# Patient Record
Sex: Female | Born: 1980 | Race: Asian | Hispanic: No | Marital: Married | Smoking: Never smoker
Health system: Southern US, Community
[De-identification: ages and names within clinical notes are randomized; demographics above are authoritative.]

## PROBLEM LIST (undated history)

## (undated) DIAGNOSIS — J45909 Unspecified asthma, uncomplicated: Secondary | ICD-10-CM

## (undated) DIAGNOSIS — N390 Urinary tract infection, site not specified: Secondary | ICD-10-CM

## (undated) HISTORY — DX: Urinary tract infection, site not specified: N39.0

## (undated) HISTORY — DX: Unspecified asthma, uncomplicated: J45.909

---

## 2012-09-20 DIAGNOSIS — E785 Hyperlipidemia, unspecified: Secondary | ICD-10-CM | POA: Insufficient documentation

## 2014-08-12 DIAGNOSIS — F32A Depression, unspecified: Secondary | ICD-10-CM | POA: Insufficient documentation

## 2014-08-12 DIAGNOSIS — F329 Major depressive disorder, single episode, unspecified: Secondary | ICD-10-CM | POA: Insufficient documentation

## 2018-03-08 ENCOUNTER — Ambulatory Visit: Payer: Self-pay | Admitting: Family Medicine

## 2018-03-08 DIAGNOSIS — Z0289 Encounter for other administrative examinations: Secondary | ICD-10-CM

## 2018-04-19 ENCOUNTER — Encounter: Payer: Self-pay | Admitting: Family Medicine

## 2018-04-19 ENCOUNTER — Ambulatory Visit (INDEPENDENT_AMBULATORY_CARE_PROVIDER_SITE_OTHER): Payer: No Typology Code available for payment source | Admitting: Family Medicine

## 2018-04-19 VITALS — BP 98/68 | HR 76 | Temp 98.4°F | Ht 62.0 in | Wt 137.0 lb

## 2018-04-19 DIAGNOSIS — Z0000 Encounter for general adult medical examination without abnormal findings: Secondary | ICD-10-CM | POA: Diagnosis not present

## 2018-04-19 LAB — COMPREHENSIVE METABOLIC PANEL
ALT: 7 U/L (ref 0–35)
AST: 9 U/L (ref 0–37)
Albumin: 4.5 g/dL (ref 3.5–5.2)
Alkaline Phosphatase: 54 U/L (ref 39–117)
BUN: 14 mg/dL (ref 6–23)
CALCIUM: 9.7 mg/dL (ref 8.4–10.5)
CO2: 28 mEq/L (ref 19–32)
Chloride: 107 mEq/L (ref 96–112)
Creatinine, Ser: 0.74 mg/dL (ref 0.40–1.20)
GFR: 89.09 mL/min (ref >60.00)
Glucose, Bld: 86 mg/dL (ref 70–99)
Potassium: 5 mEq/L (ref 3.5–5.1)
Sodium: 140 mEq/L (ref 135–145)
Total Bilirubin: 0.5 mg/dL (ref 0.2–1.2)
Total Protein: 7.4 g/dL (ref 6.0–8.3)

## 2018-04-19 LAB — CBC
HEMATOCRIT: 40.1 % (ref 36.0–46.0)
Hemoglobin: 13.3 g/dL (ref 12.0–15.0)
MCHC: 33.2 g/dL (ref 30.0–36.0)
MCV: 87 fl (ref 78.0–100.0)
Platelets: 236 10*3/uL (ref 150.0–400.0)
RBC: 4.62 Mil/uL (ref 3.87–5.11)
RDW: 13 % (ref 11.5–15.5)
WBC: 7.9 10*3/uL (ref 4.0–10.5)

## 2018-04-19 LAB — LIPID PANEL
Cholesterol: 205 mg/dL — ABNORMAL HIGH (ref 0–200)
HDL: 59.5 mg/dL (ref >39.00)
LDL Cholesterol: 117 mg/dL — ABNORMAL HIGH (ref 0–99)
NonHDL: 145.97
Total CHOL/HDL Ratio: 3
Triglycerides: 145 mg/dL (ref 0.0–149.0)
VLDL: 29 mg/dL (ref 0.0–40.0)

## 2018-04-19 NOTE — Patient Instructions (Addendum)
Give [place] 2-3 business days to get the results of your labs back.   Keep the diet clean and stay active.  Stretch your pectorals.  Let us know if you need anything.

## 2018-04-19 NOTE — Progress Notes (Signed)
Chief Complaint  Patient presents with  . New Patient (Initial Visit)     Well Woman Jody Douglas is here for a complete physical.   Her last physical was >1 year ago.  Current diet: in general, a "healthy" diet.   Current exercise: none; active at home, sometimes will walk Weight trend: has gained a little Daytime fatigue? Yes, works nights Seat belt? Yes.    Health Maintenance Pap/HPV- Yes Tetanus- Yes HIV screening- Yes  Past Medical History:  Diagnosis Date  . Asthma   . UTI (urinary tract infection)      Past Surgical History:  Procedure Laterality Date  . VAGINAL DELIVERY     2008, 2013 and 2018    Medications  No current outpatient medications on file prior to visit.   No current facility-administered medications on file prior to visit.      Allergies No Known Allergies  Review of Systems: Constitutional:  no unexpected weight changes Eye:  no recent significant change in vision Ear/Nose/Mouth/Throat:  Ears:  no tinnitus or vertigo and no recent change in hearing Nose/Mouth/Throat:  no complaints of nasal congestion, no sore throat Cardiovascular: no chest pain Respiratory:  no cough and no shortness of breath Gastrointestinal:  no abdominal pain, no change in bowel habits GU:  Female: negative for dysuria or pelvic pain Musculoskeletal/Extremities:  no pain of the joints Integumentary (Skin/Breast):  no abnormal skin lesions reported Neurologic:  no headaches Endocrine:  denies fatigue Hematologic/Lymphatic:  No areas of easy bleeding  Exam BP 98/68 (BP Location: Left Arm, Patient Position: Sitting, Cuff Size: Normal)   Pulse 76   Temp 98.4 F (36.9 C) (Oral)   Ht 5\' 2"  (1.575 m)   Wt 137 lb (62.1 kg)   SpO2 98%   BMI 25.06 kg/m  General:  well developed, well nourished, in no apparent distress Skin:  no significant moles, warts, or growths Head:  no masses, lesions, or tenderness Eyes:  pupils equal and round, sclera anicteric without  injection Ears:  canals without lesions, TMs shiny without retraction, no obvious effusion, no erythema Nose:  nares patent, septum midline, mucosa normal, and no drainage or sinus tenderness Throat/Pharynx:  lips and gingiva without lesion; tongue and uvula midline; non-inflamed pharynx; no exudates or postnasal drainage Neck: neck supple without adenopathy, thyromegaly, or masses Lungs:  clear to auscultation, breath sounds equal bilaterally, no respiratory distress Cardio:  regular rate and rhythm, no bruits, no LE edema Abdomen:  abdomen soft, nontender; bowel sounds normal; no masses or organomegaly Genital: Defer to GYN Musculoskeletal:  symmetrical muscle groups noted without atrophy or deformity Extremities:  no clubbing, cyanosis, or edema, no deformities, no skin discoloration Neuro:  gait normal; deep tendon reflexes normal and symmetric Psych: well oriented with normal range of affect and appropriate judgment/insight  Assessment and Plan  Well adult exam - Plan: CBC, Comprehensive metabolic panel, Lipid panel   Well 38 y.o. female. Counseled on diet and exercise. Other orders as above. Follow up in 1 yr or prn. The patient voiced understanding and agreement to the plan.  Jilda Roche [place], DO 04/19/18 8:49 AM

## 2018-08-27 ENCOUNTER — Encounter: Payer: Self-pay | Admitting: Family Medicine

## 2018-08-27 ENCOUNTER — Ambulatory Visit (INDEPENDENT_AMBULATORY_CARE_PROVIDER_SITE_OTHER): Payer: No Typology Code available for payment source | Admitting: Family Medicine

## 2018-08-27 DIAGNOSIS — S29012A Strain of muscle and tendon of back wall of thorax, initial encounter: Secondary | ICD-10-CM | POA: Diagnosis not present

## 2018-08-27 MED ORDER — MELOXICAM 15 MG PO TABS: 15.0000 mg | ORAL_TABLET | Freq: Every day | ORAL | 0 refills | Status: DC

## 2018-08-27 MED ORDER — CYCLOBENZAPRINE HCL 5 MG PO TABS: 2.5000 mg | ORAL_TABLET | Freq: Three times a day (TID) | ORAL | 0 refills | Status: DC | PRN

## 2018-08-27 MED FILL — MELOXICAM 15 MG TABLET: 15 | 30 days supply | Qty: 30 | Fill #0

## 2018-08-27 MED FILL — CYCLOBENZAPRINE HCL 5 MG TA: 5 | 6 days supply | Qty: 20 | Fill #0

## 2018-08-27 NOTE — Progress Notes (Signed)
Musculoskeletal Exam  Patient: Jody Douglas DOB: May 15, 1980  DOS: 08/27/2018  SUBJECTIVE:  Chief Complaint:   Chief Complaint  Patient presents with  . Back Pain    Jody Douglas is a 38 y.o.  female for evaluation and treatment of her back pain. Due to COVID-19 pandemic, we are interacting via web portal for an electronic face-to-face visit. I verified patient's ID using 2 identifiers. Patient agreed to proceed with visit via this method. Patient is at home, I am at office. Patient and I are present for visit.   Onset:  6 days ago.  No inj or change in activity.  Location: upper lateral Character:  aching  Progression of issue:  is unchanged Associated symptoms: none Denies bowel/bladder incontinence or weakness Treatment: to date has been acetaminophen.   Neurovascular symptoms: no  ROS: Musculoskeletal/Extremities: +back pain Neurologic: no numbness, tingling no weakness   Past Medical History:  Diagnosis Date  . Asthma     Objective: No conversational dyspnea Age appropriate judgment and insight Nml affect and mood  Assessment:  Strain of latissimus dorsi muscle, initial encounter - Plan: meloxicam (MOBIC) 15 MG tablet, cyclobenzaprine (FLEXERIL) 5 MG tablet, heat, stretch, don't get pregnant on these meds.   Plan: F/u prn. The patient voiced understanding and agreement to the plan.   [place], DO 08/27/18  2:56 PM

## 2018-08-28 ENCOUNTER — Telehealth: Payer: Self-pay

## 2018-08-28 NOTE — Telephone Encounter (Signed)
Copied from [place] [id number]. Topic: General - Other >> Aug 28, 2018 12:14 PM Sheran Luz wrote: Patient had appointment with Dr. Nani Ravens yesterday (6/29) and is requesting a letter for her employer saying that she may return back to work. Patient requests this be printed so she can pick up from office.

## 2018-08-28 NOTE — Telephone Encounter (Signed)
Printed

## 2018-08-29 NOTE — Telephone Encounter (Signed)
Left detailed message about paperwork left at the office    Kristie: Can you check to see if the paperwork is up front. Shirlean Mylar is not here today and neither am I

## 2018-08-29 NOTE — Telephone Encounter (Signed)
There is nothing in the file folders in the front.

## 2018-08-29 NOTE — Telephone Encounter (Signed)
The letter maybe on robins desk if not can you print off and leave in front file folders

## 2018-08-30 NOTE — Telephone Encounter (Signed)
Thanks

## 2018-08-30 NOTE — Telephone Encounter (Signed)
Late entry, this was printed and in front files

## 2018-10-15 ENCOUNTER — Ambulatory Visit (INDEPENDENT_AMBULATORY_CARE_PROVIDER_SITE_OTHER): Payer: No Typology Code available for payment source | Admitting: Family Medicine

## 2018-10-15 ENCOUNTER — Other Ambulatory Visit: Payer: Self-pay

## 2018-10-15 ENCOUNTER — Encounter: Payer: Self-pay | Admitting: Family Medicine

## 2018-10-15 VITALS — BP 110/60 | HR 79 | Temp 98.2°F | Wt 141.1 lb

## 2018-10-15 DIAGNOSIS — N3090 Cystitis, unspecified without hematuria: Secondary | ICD-10-CM | POA: Diagnosis not present

## 2018-10-15 LAB — POCT URINALYSIS DIPSTICK
Bilirubin, UA: NEGATIVE
Blood, UA: NEGATIVE
Glucose, UA: NEGATIVE
Ketones, UA: NEGATIVE
Leukocytes, UA: NEGATIVE
Nitrite, UA: NEGATIVE
Protein, UA: NEGATIVE
Spec Grav, UA: 1.025 (ref 1.010–1.025)
Urobilinogen, UA: 0.2 E.U./dL
pH, UA: 5.5 (ref 5.0–8.0)

## 2018-10-15 MED ORDER — SULFAMETHOXAZOLE-TRIMETHOPRIM 800-160 MG PO TABS: 1.0000 | ORAL_TABLET | Freq: Two times a day (BID) | ORAL | 0 refills | Status: AC

## 2018-10-15 MED ORDER — FLUCONAZOLE 150 MG PO TABS: 150.0000 mg | ORAL_TABLET | Freq: Once | ORAL | 0 refills | Status: AC

## 2018-10-15 MED ORDER — CEFTRIAXONE SODIUM 1 G IJ SOLR
1.0000 g | Freq: Once | INTRAMUSCULAR | Status: AC
  Administered 2018-10-15: 1 g via INTRAMUSCULAR

## 2018-10-15 MED FILL — SULFAMETHOXAZOLE-TMP DS TAB: 800-160 | 5 days supply | Qty: 10 | Fill #0

## 2018-10-15 MED FILL — FLUCONAZOLE 150 MG TABS: 150 | 1 days supply | Qty: 1 | Fill #0

## 2018-10-15 NOTE — Progress Notes (Signed)
Chief Complaint  Patient presents with  . Urinary Frequency  . Dysuria  . Back Pain    Jody Douglas is a 38 y.o. female here for possible UTI.  Duration: 1 week. Symptoms: urinary frequency, urinary hesitancy, urinary retention, flank pain on left and dysuria Denies: hematuria, fever, nausea and vomiting, vaginal discharge Hx of recurrent UTI? Yes Denies new sexual partners.  ROS:  Constitutional: denies fever GU: As noted in HPI  Past Medical History:  Diagnosis Date  . Asthma     BP 110/60 (BP Location: Left Arm, Patient Position: Sitting, Cuff Size: Normal)   Pulse 79   Temp 98.2 F (36.8 C) (Oral)   Wt 141 lb 2 oz (64 kg)   SpO2 97%   BMI 25.81 kg/m  General: Awake, alert, appears stated age Heart: RRR Lungs: CTAB, normal respiratory effort, no accessory muscle usage Abd: BS+, soft, TTP over suprapubic region, ND, no masses or organomegaly MSK: +L CVA tenderness Psych: Age appropriate judgment and insight  Cystitis - Plan: POCT Urinalysis Dipstick, Urine Culture, Bactrim, 1g Rocephin to cover perhaps early onset pyelo  Orders as above. Stay hydrated. Seek immediate care if pt starts to develop fevers, new/worsening symptoms, uncontrollable N/V. F/u prn. The patient voiced understanding and agreement to the plan.  [place], DO 10/15/18 3:10 PM

## 2018-10-15 NOTE — Addendum Note (Signed)
Addended by: Sharon Seller B on: 10/15/2018 03:27 PM   Modules accepted: Orders

## 2018-10-15 NOTE — Patient Instructions (Signed)
Stay hydrated.   Warning signs/symptoms: Uncontrollable nausea/vomiting, fevers, worsening symptoms despite treatment, confusion.  Give us around 2 business days to get culture back to you.  Let us know if you need anything. 

## 2018-10-16 LAB — URINE CULTURE
MICRO NUMBER:: 778755
SPECIMEN QUALITY:: ADEQUATE

## 2018-11-30 ENCOUNTER — Encounter: Payer: Self-pay | Admitting: Family Medicine

## 2018-12-03 ENCOUNTER — Encounter: Payer: Self-pay | Admitting: Family Medicine

## 2018-12-03 ENCOUNTER — Ambulatory Visit (HOSPITAL_BASED_OUTPATIENT_CLINIC_OR_DEPARTMENT_OTHER)
Admission: RE | Admit: 2018-12-03 | Discharge: 2018-12-03 | Disposition: A | Discharge status: Home / Self Care | Payer: No Typology Code available for payment source | Source: Ambulatory Visit | Attending: Family Medicine | Admitting: Family Medicine

## 2018-12-03 ENCOUNTER — Other Ambulatory Visit (HOSPITAL_COMMUNITY)
Admission: RE | Admit: 2018-12-03 | Discharge: 2018-12-03 | Disposition: A | Discharge status: Home / Self Care | Payer: No Typology Code available for payment source | Source: Ambulatory Visit | Attending: Family Medicine | Admitting: Family Medicine

## 2018-12-03 ENCOUNTER — Other Ambulatory Visit: Payer: Self-pay | Admitting: Family Medicine

## 2018-12-03 ENCOUNTER — Other Ambulatory Visit: Payer: Self-pay

## 2018-12-03 ENCOUNTER — Ambulatory Visit (INDEPENDENT_AMBULATORY_CARE_PROVIDER_SITE_OTHER): Payer: No Typology Code available for payment source | Admitting: Family Medicine

## 2018-12-03 VITALS — BP 110/78 | HR 76 | Temp 97.9°F | Ht 62.0 in | Wt 141.4 lb

## 2018-12-03 DIAGNOSIS — R109 Unspecified abdominal pain: Secondary | ICD-10-CM | POA: Diagnosis not present

## 2018-12-03 DIAGNOSIS — R300 Dysuria: Secondary | ICD-10-CM | POA: Diagnosis present

## 2018-12-03 LAB — POCT URINALYSIS DIPSTICK
Bilirubin, UA: NEGATIVE
Glucose, UA: NEGATIVE
Ketones, UA: NEGATIVE
Leukocytes, UA: NEGATIVE
Nitrite, UA: NEGATIVE
Protein, UA: NEGATIVE
Spec Grav, UA: 1.025 (ref 1.010–1.025)
Urobilinogen, UA: 0.2 E.U./dL
pH, UA: 5.5 (ref 5.0–8.0)

## 2018-12-03 MED ORDER — CEFDINIR 300 MG PO CAPS: 300.0000 mg | ORAL_CAPSULE | Freq: Two times a day (BID) | ORAL | 0 refills | Status: AC

## 2018-12-03 MED FILL — CEFDINIR 300 MG CAPSULE: 300 | 10 days supply | Qty: 20 | Fill #0

## 2018-12-03 NOTE — Patient Instructions (Addendum)
Stay hydrated.   Warning signs/symptoms: Uncontrollable nausea/vomiting, fevers, worsening symptoms despite treatment, confusion.  Give [place] around 2 business days to get culture back to you. We are checking a few other infective processes.   Make sure your bowel movements are normal!  We will be in touch regarding your X-ray.   Let us know if you need anything.

## 2018-12-03 NOTE — Progress Notes (Signed)
Chief Complaint  Patient presents with  . Back Pain  . Shoulder Pain  . Dysuria    Jody Douglas is a 38 y.o. female here for possible UTI.  Duration: 2 weeks. Symptoms: urinary hesitancy, flank pain on left and dysuria Denies: hematuria, fever, nausea, vomiting and urinary incontinence, vaginal discharge Hx of recurrent UTI? No Denies new sexual partners. She is on her cycle right now. She does have a history of kidney stones around 15 years ago.  Also of note, she has been having left-sided abdominal pain.  2 weeks ago, she went to pick up her child and felt a strain in her lower back.  The right side is since improved but her left side remains painful with the addition of other symptoms.  ROS:  Constitutional: denies fever GU: As noted in HPI  Past Medical History:  Diagnosis Date  . Asthma     BP 110/78 (BP Location: Left Arm, Patient Position: Sitting, Cuff Size: Normal)   Pulse 76   Temp 97.9 F (36.6 C) (Temporal)   Ht 5\' 2"  (1.575 m)   Wt 141 lb 6 oz (64.1 kg)   LMP 11/29/2018 (Exact Date)   SpO2 98%   BMI 25.86 kg/m  General: Awake, alert, appears stated age Heart: RRR Lungs: CTAB, normal respiratory effort, no accessory muscle usage Abd: BS+, soft, TTP over the left abdominal area, ND, no masses or organomegaly MSK: + Left-sided CVA tenderness; hamstrings are tight bilaterally, negative straight leg Psych: Age appropriate judgment and insight  Dysuria - Plan: DG Abd 1 View, POCT Urinalysis Dipstick, Urine Culture  Flank pain  X-ray to rule out kidney stone, she has no history of gout.  If this is positive, will treat for that, if it is negative, will treat for pyelonephritis.  I would like her to make sure her bowels are moving normally and she rehabs her back.  Offered pain medication but she declined.  Tylenol and heat/ice. Stay hydrated. Seek immediate care if pt starts to develop fevers, new/worsening symptoms, uncontrollable N/V. F/u prn. The  patient voiced understanding and agreement to the plan.  [place], DO 12/03/18 11:00 AM

## 2018-12-03 NOTE — Addendum Note (Signed)
Addended by: Sharon Seller B on: 12/03/2018 11:03 AM   Modules accepted: Orders

## 2018-12-05 LAB — URINE CULTURE
MICRO NUMBER:: 954111
Result:: NO GROWTH
SPECIMEN QUALITY:: ADEQUATE

## 2018-12-06 ENCOUNTER — Other Ambulatory Visit: Payer: Self-pay | Admitting: Family Medicine

## 2018-12-06 MED ORDER — METRONIDAZOLE 500 MG PO TABS: 500.0000 mg | ORAL_TABLET | Freq: Two times a day (BID) | ORAL | 0 refills | Status: AC

## 2018-12-06 MED FILL — METRONIDAZOLE 500 MG TABS: 500 | 7 days supply | Qty: 14 | Fill #0

## 2018-12-10 ENCOUNTER — Encounter: Payer: Self-pay | Admitting: Family Medicine

## 2018-12-14 ENCOUNTER — Other Ambulatory Visit: Payer: Self-pay

## 2018-12-14 ENCOUNTER — Ambulatory Visit (INDEPENDENT_AMBULATORY_CARE_PROVIDER_SITE_OTHER): Payer: No Typology Code available for payment source

## 2018-12-14 DIAGNOSIS — Z23 Encounter for immunization: Secondary | ICD-10-CM

## 2019-01-01 ENCOUNTER — Encounter: Payer: Self-pay | Admitting: Advanced Practice Midwife

## 2019-01-01 ENCOUNTER — Ambulatory Visit (INDEPENDENT_AMBULATORY_CARE_PROVIDER_SITE_OTHER): Payer: No Typology Code available for payment source | Admitting: Advanced Practice Midwife

## 2019-01-01 ENCOUNTER — Other Ambulatory Visit: Payer: Self-pay

## 2019-01-01 ENCOUNTER — Other Ambulatory Visit (HOSPITAL_BASED_OUTPATIENT_CLINIC_OR_DEPARTMENT_OTHER): Payer: No Typology Code available for payment source

## 2019-01-01 VITALS — BP 121/80 | HR 77 | Ht 61.0 in | Wt 137.0 lb

## 2019-01-01 DIAGNOSIS — R319 Hematuria, unspecified: Secondary | ICD-10-CM

## 2019-01-01 DIAGNOSIS — N898 Other specified noninflammatory disorders of vagina: Secondary | ICD-10-CM

## 2019-01-01 DIAGNOSIS — Z113 Encounter for screening for infections with a predominantly sexual mode of transmission: Secondary | ICD-10-CM | POA: Diagnosis not present

## 2019-01-01 DIAGNOSIS — R399 Unspecified symptoms and signs involving the genitourinary system: Secondary | ICD-10-CM | POA: Diagnosis not present

## 2019-01-01 DIAGNOSIS — R109 Unspecified abdominal pain: Secondary | ICD-10-CM

## 2019-01-01 DIAGNOSIS — B9689 Other specified bacterial agents as the cause of diseases classified elsewhere: Secondary | ICD-10-CM

## 2019-01-01 DIAGNOSIS — N760 Acute vaginitis: Secondary | ICD-10-CM

## 2019-01-01 LAB — POCT URINALYSIS DIPSTICK
Glucose, UA: NEGATIVE
Ketones, UA: NEGATIVE
Nitrite, UA: NEGATIVE
Protein, UA: POSITIVE — AB
Spec Grav, UA: 1.02 (ref 1.010–1.025)
pH, UA: 6 (ref 5.0–8.0)

## 2019-01-01 MED ORDER — TERCONAZOLE 0.4 % VA CREA: 1.0000 | TOPICAL_CREAM | Freq: Every day | VAGINAL | 2 refills | Status: DC

## 2019-01-01 MED FILL — TERCONAZOLE 0.4% CREAM: 0.4 | 7 days supply | Qty: 45 | Fill #0

## 2019-01-01 NOTE — Progress Notes (Signed)
Pt states she has been having vaginal itching with discharge x 3 months.

## 2019-01-01 NOTE — Patient Instructions (Signed)
Vaginitis Vaginitis is a condition in which the vaginal tissue swells and becomes red (inflamed). This condition is most often caused by a change in the normal balance of bacteria and yeast that live in the vagina. This change causes an overgrowth of certain bacteria or yeast, which causes the inflammation. There are different types of vaginitis, but the most common types are:  Bacterial vaginosis.  Yeast infection (candidiasis).  Trichomoniasis vaginitis. This is a sexually transmitted disease (STD).  Viral vaginitis.  Atrophic vaginitis.  Allergic vaginitis. What are the causes? The cause of this condition depends on the type of vaginitis. It can be caused by:  Bacteria (bacterial vaginosis).  Yeast, which is a fungus (yeast infection).  A parasite (trichomoniasis vaginitis).  A virus (viral vaginitis).  Low hormone levels (atrophic vaginitis). Low hormone levels can occur during pregnancy, breastfeeding, or after menopause.  Irritants, such as bubble baths, scented tampons, and feminine sprays (allergic vaginitis). Other factors can change the normal balance of the yeast and bacteria that live in the vagina. These include:  Antibiotic medicines.  Poor hygiene.  Diaphragms, vaginal sponges, spermicides, birth control pills, and intrauterine devices (IUD).  Sex.  Infection.  Uncontrolled diabetes.  A weakened defense (immune) system. What increases the risk? This condition is more likely to develop in women who:  Smoke.  Use vaginal douches, scented tampons, or scented sanitary pads.  Wear tight-fitting pants.  Wear thong underwear.  Use oral birth control pills or an IUD.  Have sex without a condom.  Have multiple sex partners.  Have an STD.  Frequently use the spermicide nonoxynol-9.  Eat lots of foods high in sugar.  Have uncontrolled diabetes.  Have low estrogen levels.  Have a weakened immune system from an immune disorder or medical  treatment.  Are pregnant or breastfeeding. What are the signs or symptoms? Symptoms vary depending on the cause of the vaginitis. Common symptoms include:  Abnormal vaginal discharge. ? The discharge is white, gray, or yellow with bacterial vaginosis. ? The discharge is thick, white, and cheesy with a yeast infection. ? The discharge is frothy and yellow or greenish with trichomoniasis.  A bad vaginal smell. The smell is fishy with bacterial vaginosis.  Vaginal itching, pain, or swelling.  Sex that is painful.  Pain or burning when urinating. Sometimes there are no symptoms. How is this diagnosed? This condition is diagnosed based on your symptoms and medical history. A physical exam, including a pelvic exam, will also be done. You may also have other tests, including:  Tests to determine the pH level (acidity or alkalinity) of your vagina.  A whiff test, to assess the odor that results when a sample of your vaginal discharge is mixed with a potassium hydroxide solution.  Tests of vaginal fluid. A sample will be examined under a microscope. How is this treated? Treatment varies depending on the type of vaginitis you have. Your treatment may include:  Antibiotic creams or pills to treat bacterial vaginosis and trichomoniasis.  Antifungal medicines, such as vaginal creams or suppositories, to treat a yeast infection.  Medicine to ease discomfort if you have viral vaginitis. Your sexual partner should also be treated.  Estrogen delivered in a cream, pill, suppository, or vaginal ring to treat atrophic vaginitis. If vaginal dryness occurs, lubricants and moisturizing creams may help. You may need to avoid scented soaps, sprays, or douches.  Stopping use of a product that is causing allergic vaginitis. Then using a vaginal cream to treat the symptoms. Follow   these instructions at home: Lifestyle  Keep your genital area clean and dry. Avoid soap, and only rinse the area with  water.  Do not douche or use tampons until your health care provider says it is okay to do so. Use sanitary pads, if needed.  Do not have sex until your health care provider approves. When you can return to sex, practice safe sex and use condoms.  Wipe from front to back. This avoids the spread of bacteria from the rectum to the vagina. General instructions  Take over-the-counter and prescription medicines only as told by your health care provider.  If you were prescribed an antibiotic medicine, take or use it as told by your health care provider. Do not stop taking or using the antibiotic even if you start to feel better.  Keep all follow-up visits as told by your health care provider. This is important. How is this prevented?  Use mild, non-scented products. Do not use things that can irritate the vagina, such as fabric softeners. Avoid the following products if they are scented: ? Feminine sprays. ? Detergents. ? Tampons. ? Feminine hygiene products. ? Soaps or bubble baths.  Let air reach your genital area. ? Wear cotton underwear to reduce moisture buildup. ? Avoid wearing underwear while you sleep. ? Avoid wearing tight pants and underwear or nylons without a cotton panel. ? Avoid wearing thong underwear.  Take off any wet clothing, such as bathing suits, as soon as possible.  Practice safe sex and use condoms. Contact a health care provider if:  You have abdominal pain.  You have a fever.  You have symptoms that last for more than 2-3 days. Get help right away if:  You have a fever and your symptoms suddenly get worse. Summary  Vaginitis is a condition in which the vaginal tissue becomes inflamed.This condition is most often caused by a change in the normal balance of bacteria and yeast that live in the vagina.  Treatment varies depending on the type of vaginitis you have.  Do not douche, use tampons , or have sex until your health care provider approves. When  you can return to sex, practice safe sex and use condoms. This information is not intended to replace advice given to you by your health care provider. Make sure you discuss any questions you have with your health care provider. Document Released: 12/12/2006 Document Revised: 01/27/2017 Document Reviewed: 03/22/2016 Elsevier Patient Education  2020 [employer] Flank Pain, Adult Flank pain is pain in your side. The flank is the area of your side between your upper belly (abdomen) and your back. The pain may occur over a short time (acute), or it may be long-term or come back often (chronic). It may be mild or very bad. Pain in this area can be caused by many different things. Follow these instructions at home:   Drink enough fluid to keep your pee (urine) clear or pale yellow.  Rest as told by your doctor.  Take over-the-counter and prescription medicines only as told by your doctor.  Keep a journal to keep track of: ? What has caused your flank pain. ? What has made it feel better.  Keep all follow-up visits as told by your doctor. This is important. Contact a doctor if:  Medicine does not help your pain.  You have new symptoms.  Your pain gets worse.  You have a fever.  Your symptoms last longer than 2-3 days.  You have trouble peeing.  You are  peeing more often than normal. Get help right away if:  You have trouble breathing.  You are short of breath.  Your belly hurts, or it is swollen or red.  You feel sick to your stomach (nauseous).  You throw up (vomit).  You feel like you will pass out, or you do pass out (faint).  You have blood in your pee. Summary  Flank pain is pain in your side. The flank is the area of your side between your upper belly (abdomen) and your back.  Flank pain may occur over a short time (acute), or it may be long-term or come back often (chronic). It may be mild or very bad.  Pain in this area can be caused by many different  things.  Contact your doctor if your symptoms get worse or they last longer than 2-3 days. This information is not intended to replace advice given to you by your health care provider. Make sure you discuss any questions you have with your health care provider. Document Released: 11/24/2007 Document Revised: 01/27/2017 Document Reviewed: 06/06/2016 Elsevier Patient Education  2020 [employer].

## 2019-01-01 NOTE — Progress Notes (Addendum)
GYNECOLOGY ENCOUNTER NOTE  Subjective:   Jody Douglas is a 38 y.o. [phone] female here for gynecologic exam.  Current complaints: Vaginal discharge and itching.  Itching comes and goes.  Not much odor.  Also has persistent left flank pain.  Had an xray a month ago which was negative.  Worried she has a kidney stone.  Pain is left flank and lower back.   Denies abnormal vaginal bleeding, discharge, pelvic pain, or other gynecologic concerns.    Gynecologic History Patient's last menstrual period was 11/30/2018. Contraception: none  Obstetric History OB History  Gravida Para Term Preterm AB Living  4 3 3   1 3   SAB TAB Ectopic Multiple Live Births    1     3    # Outcome Date GA Lbr Len/2nd Weight Sex Delivery Anes PTL Lv  4 Term 2018 [redacted]w[redacted]d   M Vag-Spont None N LIV  3 TAB 2013          2 Term 2013 [redacted]w[redacted]d   M Vag-Spont None N LIV  1 Term 2008 [redacted]w[redacted]d   M Vag-Spont EPI N LIV    Past Medical History:  Diagnosis Date  . Asthma     Past Surgical History:  Procedure Laterality Date  . VAGINAL DELIVERY     2008, 2013 and 2018    Current Outpatient Medications on File Prior to Visit  Medication Sig Dispense Refill  . fluconazole (DIFLUCAN) 150 MG tablet      No current facility-administered medications on file prior to visit.     No Known Allergies  Social History   Socioeconomic History  . Marital status: Married    Spouse name: Not on file  . Number of children: Not on file  . Years of education: Not on file  . Highest education level: Not on file  Occupational History  . Not on file  Social Needs  . Financial resource strain: Not on file  . Food insecurity    Worry: Not on file    Inability: Not on file  . Transportation needs    Medical: Not on file    Non-medical: Not on file  Tobacco Use  . Smoking status: Never Smoker  . Smokeless tobacco: Never Used  Substance and Sexual Activity  . Alcohol use: Yes    Frequency: Never    Comment: a little wine  maybe  . Drug use: Never  . Sexual activity: Yes    Birth control/protection: None  Lifestyle  . Physical activity    Days per week: Not on file    Minutes per session: Not on file  . Stress: Not on file  Relationships  . Social Herbalist on phone: Not on file    Gets together: Not on file    Attends religious service: Not on file    Active member of club or organization: Not on file    Attends meetings of clubs or organizations: Not on file    Relationship status: Not on file  . Intimate partner violence    Fear of current or ex partner: Not on file    Emotionally abused: Not on file    Physically abused: Not on file    Forced sexual activity: Not on file  Other Topics Concern  . Not on file  Social History Narrative  . Not on file    Family History  Problem Relation Age of Onset  . Asthma Mother   . Hypertension Mother   .  Hyperlipidemia Father   . Asthma Sister   . Asthma Son     The following portions of the patient's history were reviewed and updated as appropriate: allergies, current medications, past family history, past medical history, past social history, past surgical history and problem list.  Review of Systems A comprehensive review of systems was negative except for: Gastrointestinal: positive for flank pain on left with low back pain Genitourinary: positive for vaginal discharge and vaginal itching off and on   Objective:  BP 121/80   Pulse 77   Ht 5\' 1"  (1.549 m)   Wt 62.1 kg   LMP 11/30/2018   BMI 25.89 kg/m  CONSTITUTIONAL: Well-developed, well-nourished female in no acute distress.  NEUROLGIC: Alert and oriented to person, place, and time.  PSYCHIATRIC: Normal mood and affect. Normal behavior. CARDIOVASCULAR: Normal heart rate noted RESPIRATORY: Effort normal, no problems with respiration noted. ABDOMEN: Soft, normal bowel sounds, no distention noted.  No tenderness, rebound or guarding.  PELVIC: Normal appearing external  genitalia; normal appearing vaginal mucosa and cervix.  No abnormal discharge noted.  Some mild erethema at introitus of vagina.   MUSCULOSKELETAL: Normal range of motion.   Assessment:  Vaginal discharge Vaginal itching Left flank pain with low back pain, persistent Microscopic hematuria   Plan:  Will follow up results of  Cervicovaginal testing Rx Terazol 7 for presumed yeast vaginitis Consulted Dr 01/30/2019 re: hematuria and flank pain.  Recommends CT stone study Will refer to Urology as needed. Please refer to After Visit Summary for other counseling recommendations.    Adrian Blackwater CNM, MSN [facility]  Post visit addendum: + BV Will send Rx for Flagyl [facility], CNM

## 2019-01-02 LAB — CERVICOVAGINAL ANCILLARY ONLY
Bacterial Vaginitis (gardnerella): POSITIVE — AB
Candida Glabrata: NEGATIVE
Candida Vaginitis: NEGATIVE
Chlamydia: NEGATIVE
Comment: NEGATIVE
Comment: NEGATIVE
Comment: NEGATIVE
Comment: NEGATIVE
Comment: NEGATIVE
Comment: NORMAL
Neisseria Gonorrhea: NEGATIVE
Trichomonas: NEGATIVE

## 2019-01-02 MED ORDER — METRONIDAZOLE 500 MG PO TABS: 500.0000 mg | ORAL_TABLET | Freq: Two times a day (BID) | ORAL | 0 refills | Status: AC

## 2019-01-02 NOTE — Addendum Note (Signed)
Addended by: Seabron Spates on: 01/02/2019 08:16 PM   Modules accepted: Orders

## 2019-01-03 ENCOUNTER — Other Ambulatory Visit: Payer: Self-pay | Admitting: Advanced Practice Midwife

## 2019-01-03 ENCOUNTER — Telehealth: Payer: Self-pay

## 2019-01-03 DIAGNOSIS — R109 Unspecified abdominal pain: Secondary | ICD-10-CM

## 2019-01-03 DIAGNOSIS — N3001 Acute cystitis with hematuria: Secondary | ICD-10-CM

## 2019-01-03 LAB — URINE CULTURE

## 2019-01-03 MED ORDER — NITROFURANTOIN MONOHYD MACRO 100 MG PO CAPS: 100.0000 mg | ORAL_CAPSULE | Freq: Two times a day (BID) | ORAL | 1 refills | Status: DC

## 2019-01-03 MED FILL — METRONIDAZOLE 500 MG TABS: 500 | 7 days supply | Qty: 14 | Fill #0

## 2019-01-03 NOTE — Telephone Encounter (Signed)
Called and left message for pt to call the office back. chiquita l wilson, CMA 

## 2019-01-03 NOTE — Telephone Encounter (Signed)
-----   Message from Seabron Spates, CNM sent at 01/02/2019  8:15 PM EST ----- Regarding: New Rx Got results + BV I sent Rx to [facility] for flagyl  Can you call?  Lelan Pons

## 2019-01-03 NOTE — Telephone Encounter (Signed)
Pt called the office requesting results. Pt made aware that she tested positive for BV.  Understanding was voiced.  Medication was sent to pharmacy.  chiquita l wilson, CMA

## 2019-01-03 NOTE — Progress Notes (Signed)
Urine grew E Coli Resistant to many meds Sensitive to [facility]  Rx sent to pharmacy

## 2019-01-04 ENCOUNTER — Telehealth: Payer: Self-pay

## 2019-01-04 MED FILL — NITROFURANTOIN MONO-MCR 100: 100 | 7 days supply | Qty: 14 | Fill #0

## 2019-01-04 NOTE — Telephone Encounter (Signed)
-----   Message from Seabron Spates, CNM sent at 01/03/2019  8:32 PM EST ----- Regarding: UTI med sent She grew E Coli in urine Probably why she had pain  I sent Macrobid to pharmacy It was resistant to other meds  Can yall call her?  Jody Douglas

## 2019-01-04 NOTE — Telephone Encounter (Signed)
-----   Message from Marie L Williams, CNM sent at 01/03/2019  8:32 PM EST ----- Regarding: UTI med sent She grew E Coli in urine Probably why she had pain  I sent Macrobid to pharmacy It was resistant to other meds  Can yall call her?  Marie  

## 2019-01-04 NOTE — Telephone Encounter (Signed)
Patient called and made aware of UTI and that it is suspectible to [place]. Patient made aware that Hansel Feinstein, CNM has sent in prescription to [facility] for patient to pick up and start taking. Patient states understanding. Kathrene Alu RN

## 2019-01-04 NOTE — Telephone Encounter (Signed)
Called pt regarding results. Pt made aware that she has a UTI and Macrobid was sent to her pharmacy. Understanding was voiced.  Jody Douglas l [name], CMA

## 2019-01-10 ENCOUNTER — Other Ambulatory Visit: Payer: Self-pay

## 2019-01-10 ENCOUNTER — Ambulatory Visit (HOSPITAL_BASED_OUTPATIENT_CLINIC_OR_DEPARTMENT_OTHER)
Admission: RE | Admit: 2019-01-10 | Discharge: 2019-01-10 | Disposition: A | Discharge status: Home / Self Care | Payer: No Typology Code available for payment source | Source: Ambulatory Visit | Attending: Advanced Practice Midwife | Admitting: Advanced Practice Midwife

## 2019-01-10 DIAGNOSIS — R109 Unspecified abdominal pain: Secondary | ICD-10-CM | POA: Insufficient documentation

## 2019-01-15 ENCOUNTER — Encounter: Payer: Self-pay | Admitting: Advanced Practice Midwife

## 2019-01-18 ENCOUNTER — Encounter: Payer: Self-pay | Admitting: Family Medicine

## 2019-02-15 ENCOUNTER — Ambulatory Visit (INDEPENDENT_AMBULATORY_CARE_PROVIDER_SITE_OTHER): Payer: No Typology Code available for payment source | Admitting: Family Medicine

## 2019-02-15 ENCOUNTER — Other Ambulatory Visit: Payer: Self-pay

## 2019-02-15 ENCOUNTER — Other Ambulatory Visit (HOSPITAL_COMMUNITY)
Admission: RE | Admit: 2019-02-15 | Discharge: 2019-02-15 | Disposition: A | Discharge status: Home / Self Care | Payer: No Typology Code available for payment source | Source: Ambulatory Visit | Attending: Family Medicine | Admitting: Family Medicine

## 2019-02-15 ENCOUNTER — Encounter: Payer: Self-pay | Admitting: Family Medicine

## 2019-02-15 VITALS — BP 102/68 | HR 86 | Temp 97.3°F | Ht 61.0 in | Wt 141.1 lb

## 2019-02-15 DIAGNOSIS — R300 Dysuria: Secondary | ICD-10-CM

## 2019-02-15 DIAGNOSIS — N898 Other specified noninflammatory disorders of vagina: Secondary | ICD-10-CM | POA: Diagnosis present

## 2019-02-15 DIAGNOSIS — S46811A Strain of other muscles, fascia and tendons at shoulder and upper arm level, right arm, initial encounter: Secondary | ICD-10-CM

## 2019-02-15 LAB — POC URINALSYSI DIPSTICK (AUTOMATED)
Bilirubin, UA: NEGATIVE
Blood, UA: NEGATIVE
Glucose, UA: NEGATIVE
Ketones, UA: NEGATIVE
Leukocytes, UA: NEGATIVE
Nitrite, UA: NEGATIVE
Protein, UA: NEGATIVE
Spec Grav, UA: 1.02 (ref 1.010–1.025)
Urobilinogen, UA: 0.2 E.U./dL
pH, UA: 6 (ref 5.0–8.0)

## 2019-02-15 NOTE — Progress Notes (Signed)
Chief Complaint  Patient presents with  . Back Pain  . Urinary Tract Infection    Jody Douglas is a 38 y.o. female here for possible UTI.  Duration: 3 months. Symptoms: Dysuria, urinary frequency, urinary hesitancy and chills Denies: hematuria, fever, nausea and vomiting, vaginal discharge Hx of recurrent UTI? No Denies new sexual partners. Tx'd for BV by me, completed course, did not fully work. Tx'd for BV by GYN and for UTI proven on culture, did not help. Compliant w medication.  R shoulder pain for past 1 week. Worse after she works. No inj or change in activity otherwise. Denies bruising, redness, swelling, weakness.   ROS:  Constitutional: denies fever GU: As noted in HPI  Past Medical History:  Diagnosis Date  . Asthma      BP 102/68 (BP Location: Left Arm, Patient Position: Sitting, Cuff Size: Normal)   Pulse 86   Temp (!) 97.3 F (36.3 C) (Temporal)   Ht 5\' 1"  (1.549 m)   Wt 141 lb 2 oz (64 kg)   SpO2 99%   BMI 26.67 kg/m  General: Awake, alert, appears stated age Heart: RRR Lungs: CTAB, normal respiratory effort, no accessory muscle usage Abd: BS+, soft, NT, ND, no masses or organomegaly MSK: No CVA tenderness, neg Lloyd's sign; +TTP over R trap, nml ROM, no ttp over cerv spine msc Neuro: Neg Spurling's Psych: Age appropriate judgment and insight  Dysuria - Plan: Urine Culture, Urine cytology ancillary only([place]), POCT Urinalysis Dipstick (Automated), CANCELED: POCT Urinalysis Dipstick (Automated)  Vaginal discharge - Plan: Urine Culture, Urine cytology ancillary only([place]), CANCELED: POCT Urinalysis Dipstick (Automated)  Strain of right trapezius muscle, initial encounter  1- States her BM's are neg, OB did exam and no pelvic floor ttp. Question BV? Could do clinda if 2 rounds of Flagyl was not helpful. Could add vag probiotic. Await urine culture. IC now a consideration. If all of her workup is normal, I will rec Uribel or pyridium and  refer to urology. 2- She has stretches/exercises for this, has not been compliant. NSAIDs, heat, TYlenol, needs to do them. F/u prn. The patient voiced understanding and agreement to the plan.  [place], DO 02/15/19 2:43 PM

## 2019-02-15 NOTE — Patient Instructions (Signed)
Stay hydrated.   Warning signs/symptoms: Uncontrollable nausea/vomiting, fevers, worsening symptoms despite treatment, confusion.  Give [place] around 2 business days to get culture back to you. Some of the other results can take up to a week. No news is good news in general.   Let us know if you need anything.

## 2019-02-16 ENCOUNTER — Encounter: Payer: Self-pay | Admitting: Family Medicine

## 2019-02-16 LAB — URINE CULTURE
MICRO NUMBER:: 1212804
Result:: NO GROWTH
SPECIMEN QUALITY:: ADEQUATE

## 2019-02-21 LAB — URINE CYTOLOGY ANCILLARY ONLY
Bacterial Vaginitis-Urine: POSITIVE — AB
Candida Glabrata: NEGATIVE
Candida Vaginitis: NEGATIVE
Chlamydia: NEGATIVE
Comment: NEGATIVE
Comment: NEGATIVE
Comment: NEGATIVE
Comment: NEGATIVE
Comment: NORMAL
Neisseria Gonorrhea: NEGATIVE
Trichomonas: NEGATIVE

## 2019-02-22 ENCOUNTER — Other Ambulatory Visit: Payer: Self-pay | Admitting: Family Medicine

## 2019-02-22 MED ORDER — CLINDAMYCIN PHOSPHATE 2 % VA CREA: 1.0000 | TOPICAL_CREAM | Freq: Every day | VAGINAL | 0 refills | Status: AC

## 2019-02-25 MED FILL — CLINDAMYCIN 2% VAGINAL CRM: 2 | 5 days supply | Qty: 40 | Fill #0

## 2019-04-16 ENCOUNTER — Ambulatory Visit (INDEPENDENT_AMBULATORY_CARE_PROVIDER_SITE_OTHER): Payer: No Typology Code available for payment source | Admitting: Family Medicine

## 2019-04-16 ENCOUNTER — Other Ambulatory Visit: Payer: Self-pay

## 2019-04-16 ENCOUNTER — Encounter: Payer: Self-pay | Admitting: Family Medicine

## 2019-04-16 VITALS — BP 98/62 | HR 79 | Temp 97.7°F | Ht 61.0 in | Wt 140.0 lb

## 2019-04-16 DIAGNOSIS — Z0000 Encounter for general adult medical examination without abnormal findings: Secondary | ICD-10-CM

## 2019-04-16 LAB — CBC
HCT: 38.7 % (ref 36.0–46.0)
Hemoglobin: 12.9 g/dL (ref 12.0–15.0)
MCHC: 33.3 g/dL (ref 30.0–36.0)
MCV: 86.4 fl (ref 78.0–100.0)
Platelets: 209 10*3/uL (ref 150.0–400.0)
RBC: 4.48 Mil/uL (ref 3.87–5.11)
RDW: 12.8 % (ref 11.5–15.5)
WBC: 6.8 10*3/uL (ref 4.0–10.5)

## 2019-04-16 LAB — COMPREHENSIVE METABOLIC PANEL
ALT: 9 U/L (ref 0–35)
AST: 11 U/L (ref 0–37)
Albumin: 4.4 g/dL (ref 3.5–5.2)
Alkaline Phosphatase: 49 U/L (ref 39–117)
BUN: 10 mg/dL (ref 6–23)
CO2: 27 mEq/L (ref 19–32)
Calcium: 9.2 mg/dL (ref 8.4–10.5)
Chloride: 104 mEq/L (ref 96–112)
Creatinine, Ser: 0.64 mg/dL (ref 0.40–1.20)
GFR: 104.75 mL/min (ref >60.00)
Glucose, Bld: 86 mg/dL (ref 70–99)
Potassium: 3.6 mEq/L (ref 3.5–5.1)
Sodium: 137 mEq/L (ref 135–145)
Total Bilirubin: 0.6 mg/dL (ref 0.2–1.2)
Total Protein: 7.2 g/dL (ref 6.0–8.3)

## 2019-04-16 LAB — LIPID PANEL
Cholesterol: 205 mg/dL — ABNORMAL HIGH (ref 0–200)
HDL: 66.8 mg/dL (ref >39.00)
LDL Cholesterol: 121 mg/dL — ABNORMAL HIGH (ref 0–99)
NonHDL: 138.06
Total CHOL/HDL Ratio: 3
Triglycerides: 87 mg/dL (ref 0.0–149.0)
VLDL: 17.4 mg/dL (ref 0.0–40.0)

## 2019-04-16 NOTE — Progress Notes (Signed)
Chief Complaint  Patient presents with  . Annual Exam     Well Woman Analys Jody Douglas is here for a complete physical.   Her last physical was >1 year ago.  Current diet: in general, an "OK" diet. Current exercise: walking, stairs. Seatbelt? Yes  Health Maintenance Pap/HPV- Yes Tetanus- Yes HIV screening- Yes  Past Medical History:  Diagnosis Date  . Asthma      Past Surgical History:  Procedure Laterality Date  . VAGINAL DELIVERY     2008, 2013 and 2018    Medications  No current outpatient medications on file prior to visit.   No current facility-administered medications on file prior to visit.     Allergies No Known Allergies  Review of Systems: Constitutional:  no unexpected weight changes Eye:  no recent significant change in vision Ear/Nose/Mouth/Throat:  Ears:  no tinnitus or vertigo and no recent change in hearing Nose/Mouth/Throat:  no complaints of nasal congestion, no sore throat Cardiovascular: no chest pain Respiratory:  no cough and no shortness of breath Gastrointestinal:  no abdominal pain, no change in bowel habits GU:  Female: negative for dysuria or pelvic pain Musculoskeletal/Extremities:  no pain of the joints Integumentary (Skin/Breast):  no abnormal skin lesions reported Neurologic:  no headaches Endocrine:  denies fatigue Hematologic/Lymphatic:  No areas of easy bleeding  Exam BP 98/62 (BP Location: Left Arm, Patient Position: Sitting, Cuff Size: Normal)   Pulse 79   Temp 97.7 F (36.5 C) (Temporal)   Ht 5\' 1"  (1.549 m)   Wt 63.5 kg   SpO2 98%   BMI 26.45 kg/m  General:  well developed, well nourished, in no apparent distress Skin:  no significant moles, warts, or growths Head:  no masses, lesions, or tenderness Eyes:  pupils equal and round, sclera anicteric without injection Ears:  canals without lesions, TMs shiny without retraction, no obvious effusion, no erythema Nose:  nares patent, septum midline, mucosa normal, and  no drainage or sinus tenderness Throat/Pharynx:  lips and gingiva without lesion; tongue and uvula midline; non-inflamed pharynx; no exudates or postnasal drainage Neck: neck supple without adenopathy, thyromegaly, or masses Lungs:  clear to auscultation, breath sounds equal bilaterally, no respiratory distress Cardio:  regular rate and rhythm, no bruits, no LE edema Abdomen:  abdomen soft, nontender; bowel sounds normal; no masses or organomegaly Genital: Defer to GYN Musculoskeletal:  symmetrical muscle groups noted without atrophy or deformity Extremities:  no clubbing, cyanosis, or edema, no deformities, no skin discoloration Neuro:  gait normal; deep tendon reflexes normal and symmetric Psych: well oriented with normal range of affect and appropriate judgment/insight  Assessment and Plan  Well adult exam   Well 39 y.o. female. Counseled on diet and exercise. Other orders as above. Follow up in 1 yr. The patient voiced understanding and agreement to the plan.  [address] [place], DO 04/16/19 9:58 AM

## 2019-04-16 NOTE — Patient Instructions (Signed)
Give us 2-3 business days to get the results of your labs back.   Keep the diet clean and stay active.  Let us know if you need anything. 

## 2019-12-09 ENCOUNTER — Other Ambulatory Visit: Payer: Self-pay | Admitting: Family Medicine

## 2019-12-09 ENCOUNTER — Telehealth (INDEPENDENT_AMBULATORY_CARE_PROVIDER_SITE_OTHER): Payer: No Typology Code available for payment source | Admitting: Family Medicine

## 2019-12-09 ENCOUNTER — Encounter: Payer: Self-pay | Admitting: Family Medicine

## 2019-12-09 ENCOUNTER — Other Ambulatory Visit: Payer: Self-pay

## 2019-12-09 DIAGNOSIS — U071 COVID-19: Secondary | ICD-10-CM

## 2019-12-09 MED ORDER — PREDNISONE 20 MG PO TABS: 40.0000 mg | ORAL_TABLET | Freq: Every day | ORAL | 0 refills | Status: DC

## 2019-12-09 MED FILL — predniSONE 20 MG TABS: 20 | 5 days supply | Qty: 10 | Fill #0

## 2019-12-09 NOTE — Progress Notes (Signed)
Chief Complaint  Patient presents with  . Generalized Body Aches  . Shortness of Breath    Lenix Kv Capek here for URI complaints. Due to COVID-19 pandemic, we are interacting via web portal for an electronic face-to-face visit. I verified patient's ID using 2 identifiers. Patient agreed to proceed with visit via this method. Patient is at home, I am at office. Patient and I are present for visit.   Duration: 2 weeks  Associated symptoms: sinus headache, sinus congestion, shortness of breath, myalgia and cough Denies: sinus pain, rhinorrhea, itchy watery eyes, ear pain, ear drainage, sore throat, wheezing and fevers Treatment to date: Tylenol Sick contacts: Yes - son got it first Tested positive for covid on 9/30.   She received both Pfizer doses.   Past Medical History:  Diagnosis Date  . Asthma    Exam No conversational dyspnea Age appropriate judgment and insight Nml affect and mood  COVID-19 - Plan: predniSONE (DELTASONE) 20 MG tablet  40 mg/d for 5 d.  Continue to push fluids, practice good hand hygiene, cover mouth when coughing. F/u prn. If starting to experience fluid losses she cannot keep up with, shaking, or shortness of breath, seek immediate care. Pt voiced understanding and agreement to the plan.  Jilda Roche [place], DO 12/09/19 2:27 PM

## 2019-12-10 ENCOUNTER — Telehealth: Payer: Self-pay | Admitting: Family Medicine

## 2019-12-10 NOTE — Telephone Encounter (Signed)
Faxed patients matrix paperwork. The patient is aware.

## 2020-03-05 ENCOUNTER — Other Ambulatory Visit: Payer: No Typology Code available for payment source

## 2020-05-01 ENCOUNTER — Other Ambulatory Visit: Payer: Self-pay

## 2020-05-01 ENCOUNTER — Encounter: Payer: Self-pay | Admitting: Family Medicine

## 2020-05-01 ENCOUNTER — Ambulatory Visit (INDEPENDENT_AMBULATORY_CARE_PROVIDER_SITE_OTHER): Payer: 59 | Admitting: Family Medicine

## 2020-05-01 VITALS — BP 122/72 | HR 76 | Temp 97.6°F | Resp 17 | Ht 61.0 in | Wt 142.0 lb

## 2020-05-01 DIAGNOSIS — Z1159 Encounter for screening for other viral diseases: Secondary | ICD-10-CM

## 2020-05-01 DIAGNOSIS — Z0000 Encounter for general adult medical examination without abnormal findings: Secondary | ICD-10-CM

## 2020-05-01 DIAGNOSIS — Z1331 Encounter for screening for depression: Secondary | ICD-10-CM | POA: Diagnosis not present

## 2020-05-01 LAB — LIPID PANEL
Cholesterol: 225 mg/dL — ABNORMAL HIGH (ref 0–200)
HDL: 63.4 mg/dL (ref >39.00)
LDL Cholesterol: 137 mg/dL — ABNORMAL HIGH (ref 0–99)
NonHDL: 161.84
Total CHOL/HDL Ratio: 4
Triglycerides: 122 mg/dL (ref 0.0–149.0)
VLDL: 24.4 mg/dL (ref 0.0–40.0)

## 2020-05-01 LAB — COMPREHENSIVE METABOLIC PANEL
ALT: 13 U/L (ref 0–35)
AST: 12 U/L (ref 0–37)
Albumin: 4.3 g/dL (ref 3.5–5.2)
Alkaline Phosphatase: 55 U/L (ref 39–117)
BUN: 11 mg/dL (ref 6–23)
CO2: 28 mEq/L (ref 19–32)
Calcium: 9.6 mg/dL (ref 8.4–10.5)
Chloride: 102 mEq/L (ref 96–112)
Creatinine, Ser: 0.68 mg/dL (ref 0.40–1.20)
GFR: 111.22 mL/min (ref >60.00)
Glucose, Bld: 87 mg/dL (ref 70–99)
Potassium: 3.9 mEq/L (ref 3.5–5.1)
Sodium: 138 mEq/L (ref 135–145)
Total Bilirubin: 0.6 mg/dL (ref 0.2–1.2)
Total Protein: 7.4 g/dL (ref 6.0–8.3)

## 2020-05-01 LAB — HEPATITIS C ANTIBODY
Hepatitis C Ab: NONREACTIVE
SIGNAL TO CUT-OFF: 0.02 (ref <1.00)

## 2020-05-01 NOTE — Progress Notes (Signed)
Chief Complaint  Patient presents with  . Abdominal Pain     Well Woman Jody Douglas is here for a complete physical.   Her last physical was >1 year ago.  Current diet: in general, a "healthy" diet. Current exercise: walking. Patient's last menstrual period was 04/07/2020.  Fatigue out of ordinary? No Seatbelt? Yes Loss of interested in doing things or depression in past 2 weeks? No  Health Maintenance Pap/HPV- Yes Tetanus- Yes HIV screening- Yes STI screening- No  Past Medical History:  Diagnosis Date  . Asthma      Past Surgical History:  Procedure Laterality Date  . VAGINAL DELIVERY     2008, 2013 and 2018    Medications  Takes no meds routinely.    Allergies No Known Allergies  Review of Systems: Constitutional:  no unexpected weight changes Eye:  no recent significant change in vision Ear/Nose/Mouth/Throat:  Ears:  no tinnitus or vertigo and no recent change in hearing Nose/Mouth/Throat:  no complaints of nasal congestion, no sore throat Cardiovascular: no chest pain Respiratory:  no cough and no shortness of breath Gastrointestinal:  no abdominal pain, no change in bowel habits GU:  Female: negative for dysuria or pelvic pain Musculoskeletal/Extremities:  no pain of the joints Integumentary (Skin/Breast):  no abnormal skin lesions reported Neurologic:  no headaches Endocrine:  denies fatigue Hematologic/Lymphatic:  No areas of easy bleeding  Exam BP 122/72 (BP Location: Left Arm, Patient Position: Sitting, Cuff Size: Normal)   Pulse 76   Temp 97.6 F (36.4 C)   Resp 17   Ht 5\' 1"  (1.549 m)   Wt 142 lb (64.4 kg)   LMP 04/07/2020   SpO2 97%   BMI 26.83 kg/m  General:  well developed, well nourished, in no apparent distress Skin:  no significant moles, warts, or growths Head:  no masses, lesions, or tenderness Eyes:  pupils equal and round, sclera anicteric without injection Ears:  canals without lesions, TMs shiny without retraction, no  obvious effusion, no erythema Nose:  nares patent, septum midline, mucosa normal, and no drainage or sinus tenderness Throat/Pharynx:  lips and gingiva without lesion; tongue and uvula midline; non-inflamed pharynx; no exudates or postnasal drainage Neck: neck supple without adenopathy, thyromegaly, or masses Lungs:  clear to auscultation, breath sounds equal bilaterally, no respiratory distress Cardio:  regular rate and rhythm, no bruits, no LE edema Abdomen:  abdomen soft, nontender; bowel sounds normal; no masses or organomegaly Genital: Defer to GYN Musculoskeletal:  symmetrical muscle groups noted without atrophy or deformity Extremities:  no clubbing, cyanosis, or edema, no deformities, no skin discoloration Neuro:  gait normal; deep tendon reflexes normal and symmetric Psych: well oriented with normal range of affect and appropriate judgment/insight  Assessment and Plan  Well adult exam - Plan: Lipid panel, Comprehensive metabolic panel  Depression screening negative  Encounter for hepatitis C screening test for low risk patient - Plan: Hepatitis C antibody   Well 40 y.o. female. Counseled on diet and exercise. Other orders as above. 41 x 28 x33 mm cyst on R adnexa. No pain, offered 30 in 6 mo to f/u, declined at this time which I think is very reasonable. If she changes her mind or starts having pain, will image.  Follow up in 1 yr or prn. The patient voiced understanding and agreement to the plan.  [place], DO 05/01/20 11:18 AM

## 2020-05-01 NOTE — Patient Instructions (Addendum)
Give [place] 2-3 business days to get the results of your labs back.   Keep the diet clean and stay active.  Let's keep an eye on the cyst. If you start getting chronic abd pain or change your mind, we can get a follow up ultrasound.   Let us know if you need anything.

## 2020-05-04 ENCOUNTER — Other Ambulatory Visit: Payer: Self-pay

## 2020-05-04 DIAGNOSIS — E7800 Pure hypercholesterolemia, unspecified: Secondary | ICD-10-CM

## 2020-06-15 ENCOUNTER — Other Ambulatory Visit (INDEPENDENT_AMBULATORY_CARE_PROVIDER_SITE_OTHER): Payer: No Typology Code available for payment source

## 2020-06-15 ENCOUNTER — Other Ambulatory Visit: Payer: Self-pay

## 2020-06-15 DIAGNOSIS — E7800 Pure hypercholesterolemia, unspecified: Secondary | ICD-10-CM

## 2020-06-15 LAB — LIPID PANEL
Cholesterol: 204 mg/dL — ABNORMAL HIGH (ref 0–200)
HDL: 61.5 mg/dL (ref >39.00)
LDL Cholesterol: 125 mg/dL — ABNORMAL HIGH (ref 0–99)
NonHDL: 142.92
Total CHOL/HDL Ratio: 3
Triglycerides: 91 mg/dL (ref 0.0–149.0)
VLDL: 18.2 mg/dL (ref 0.0–40.0)

## 2021-06-29 IMAGING — DX DG ABDOMEN 1V
2 series · 2 of 2 positions shown · non-contrast
Comparison: None.

CLINICAL DATA: Pt c/o left flank pain x 2 weeks, hx of kidney
stones, last one in [id number]

EXAM:
ABDOMEN - 1 VIEW

[abdomen kub (1 of 2)]
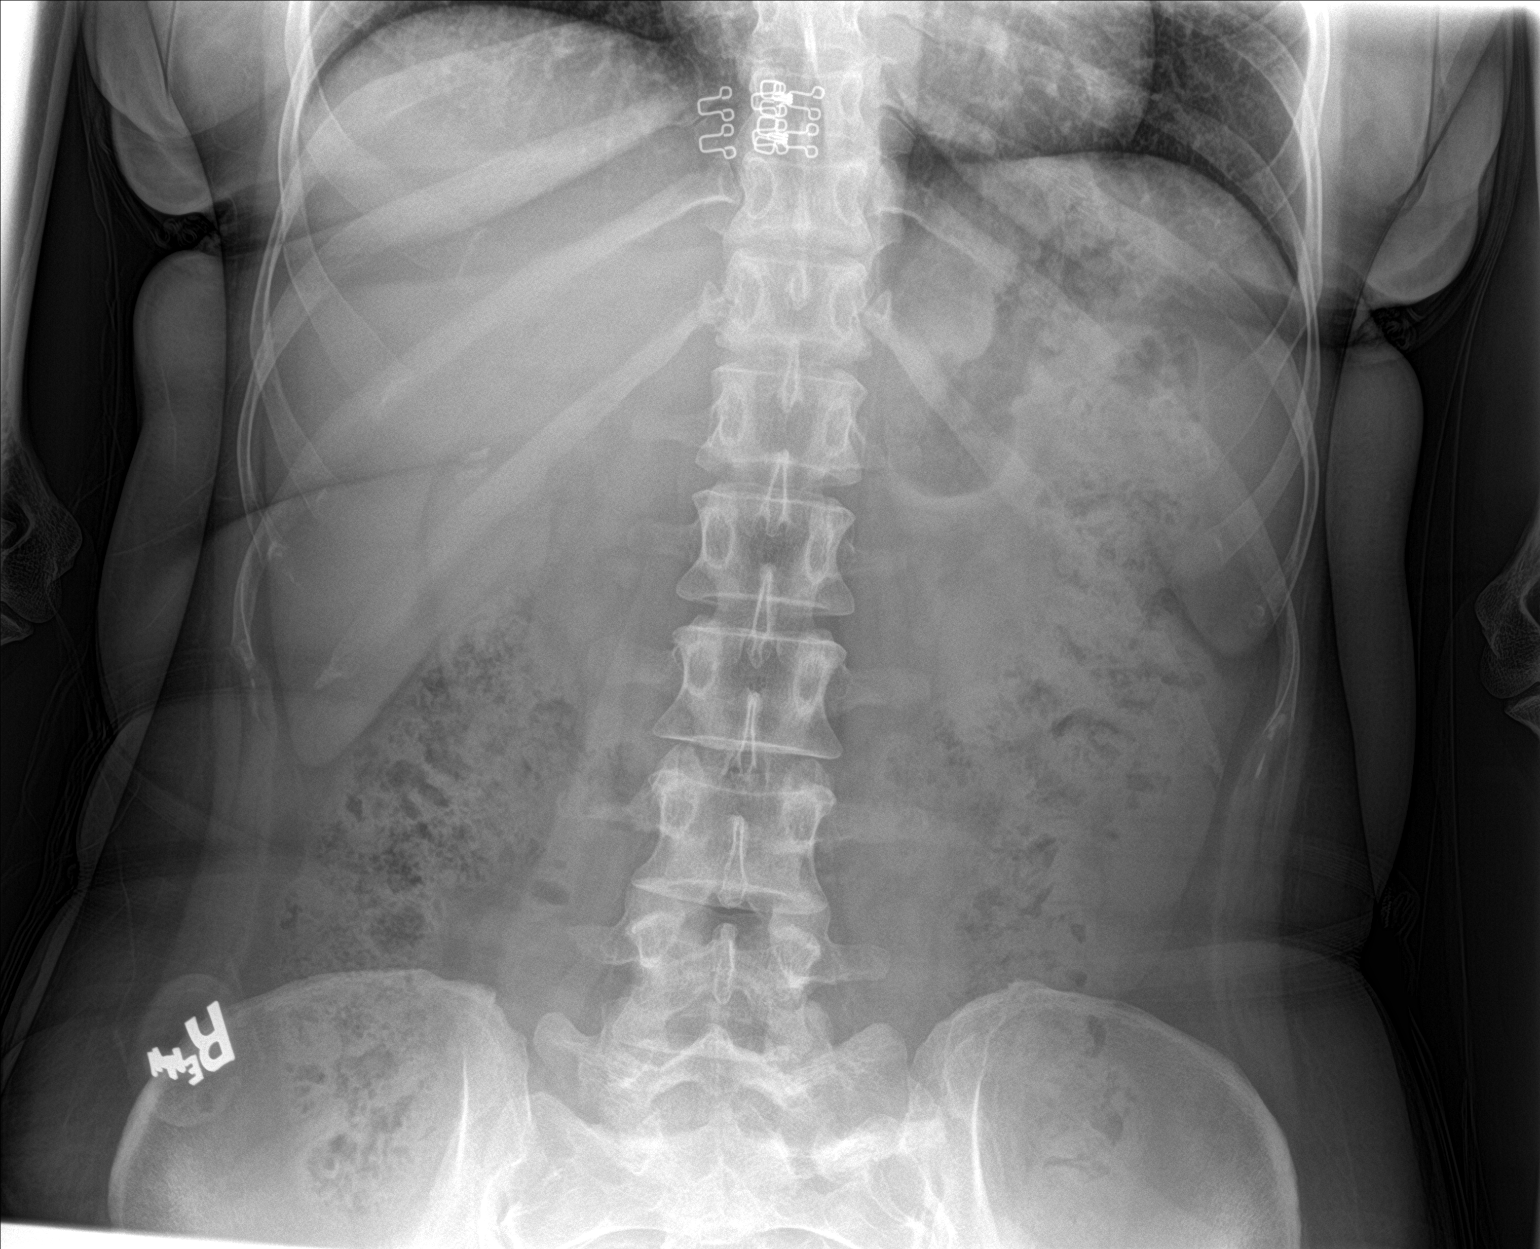

[abdomen kub (2 of 2)]
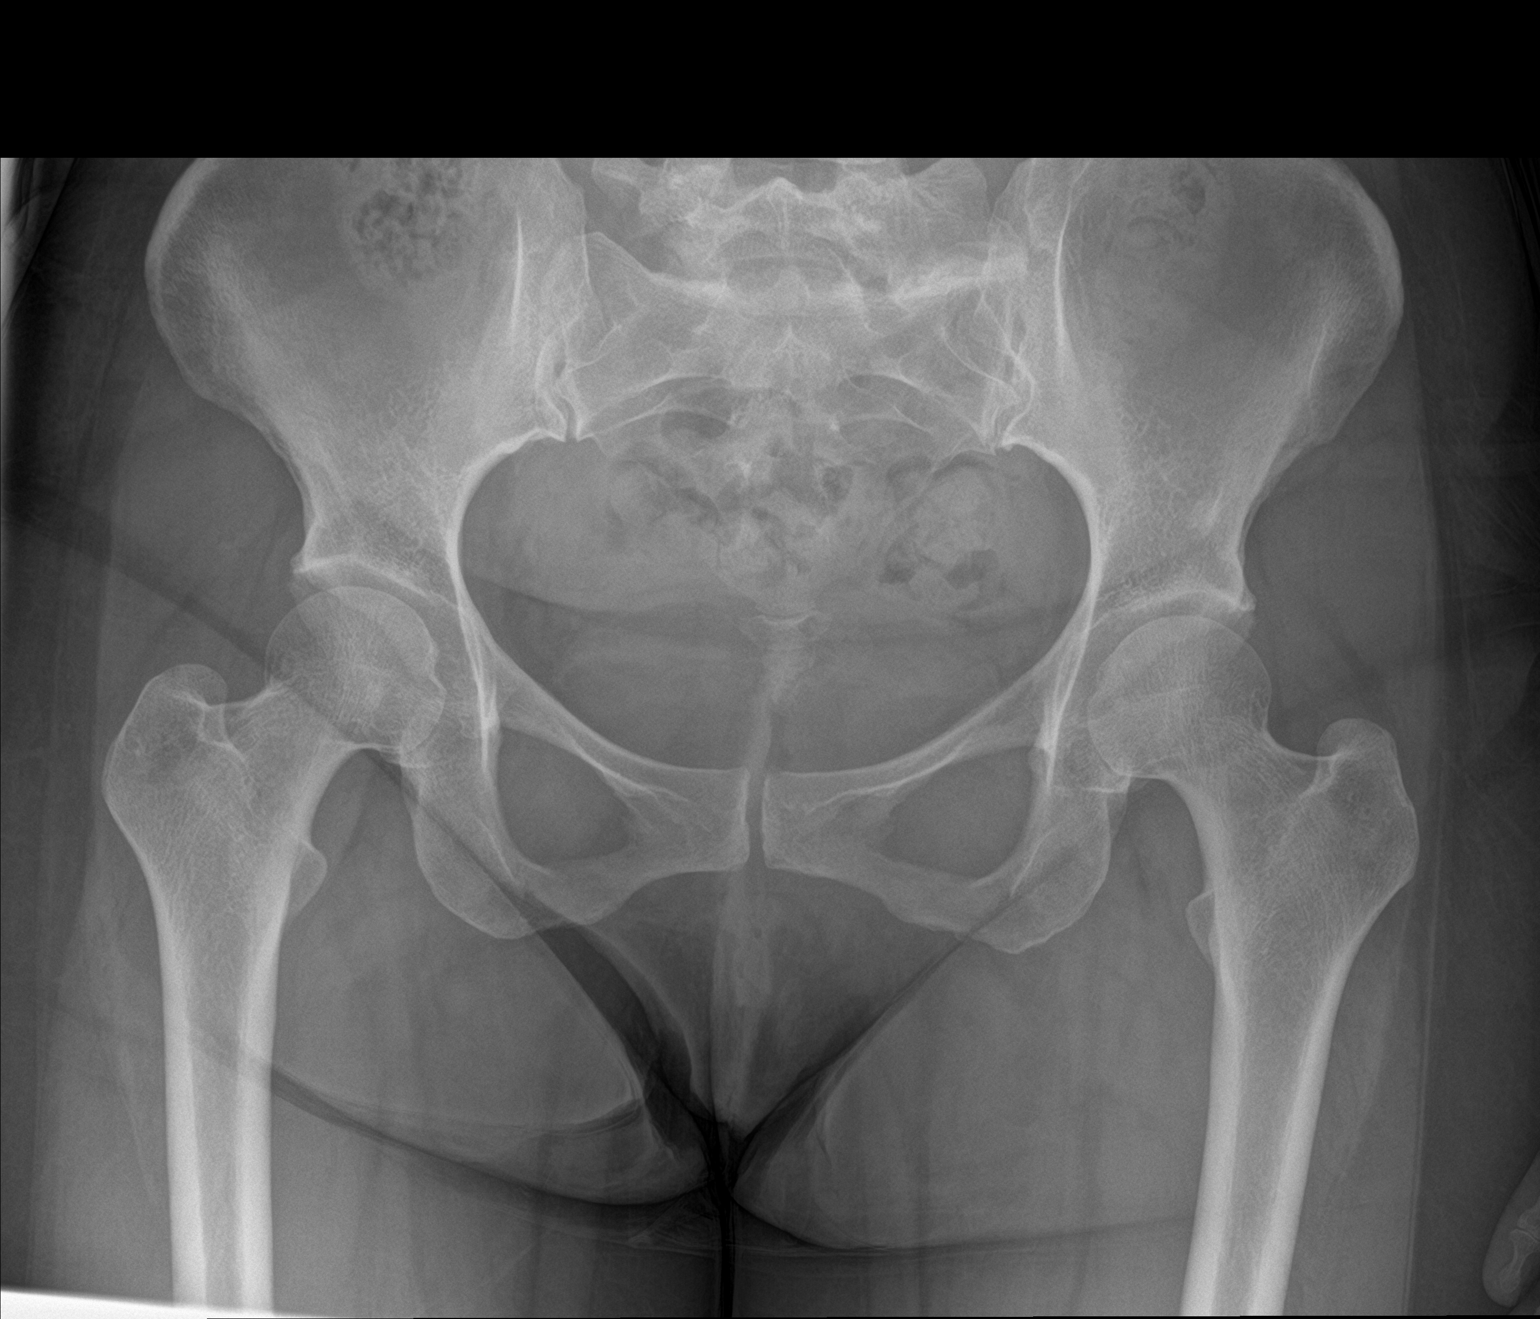

[2 of 2 positions shown; findings below may reference images not displayed]

FINDINGS: Paucity of small bowel gas but no dilated loops to suggest
obstruction. No supine evidence for free air. Stool is seen
throughout the colon. There are no radiopaque densities projecting
over the bilateral kidneys or ureters. Lung bases are clear. No
acute finding in the visualized skeleton.
IMPRESSION: 1.  No radiographic evidence of renal calculi.

2.  Nonobstructive bowel gas pattern.
